# Patient Record
Sex: Female | Born: 2005 | Race: Black or African American | Hispanic: No | Marital: Single
Health system: Southern US, Community
[De-identification: ages and names within clinical notes are randomized; demographics above are authoritative.]

## PROBLEM LIST (undated history)

## (undated) DIAGNOSIS — J45909 Unspecified asthma, uncomplicated: Secondary | ICD-10-CM

## (undated) DIAGNOSIS — Z982 Presence of cerebrospinal fluid drainage device: Secondary | ICD-10-CM

## (undated) HISTORY — PX: BRAIN SURGERY: SHX531

---
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
## 2023-05-11 ENCOUNTER — Ambulatory Visit
Admission: EM | Admit: 2023-05-11 | Discharge: 2023-05-11 | Disposition: A | Discharge status: Home / Self Care | Attending: Emergency Medicine | Admitting: Emergency Medicine

## 2023-05-11 ENCOUNTER — Encounter: Payer: Self-pay | Admitting: Emergency Medicine

## 2023-05-11 DIAGNOSIS — J039 Acute tonsillitis, unspecified: Secondary | ICD-10-CM | POA: Diagnosis not present

## 2023-05-11 LAB — POCT RAPID STREP A (OFFICE): Rapid Strep A Screen: NEGATIVE

## 2023-05-11 MED ORDER — PREDNISOLONE SODIUM PHOSPHATE 15 MG/5ML PO SOLN: 45.0000 mg | Freq: Every day | ORAL | Status: DC

## 2023-05-11 MED ORDER — AMOXICILLIN 500 MG PO CAPS: 500.0000 mg | ORAL_CAPSULE | Freq: Two times a day (BID) | ORAL | 0 refills | Status: AC

## 2023-05-11 MED ORDER — PREDNISOLONE SODIUM PHOSPHATE 15 MG/5ML PO SOLN
45.0000 mg | Freq: Once | ORAL | Status: AC
  Administered 2023-05-11: 45 mg via ORAL

## 2023-05-11 NOTE — Discharge Instructions (Addendum)
 Your rapid strep test was negative, I believe you have a different bacterial infection in your tonsils.  Take the amoxicillin twice daily for the next 10 days.  Ensure you are staying well-hydrated with at least 64 ounces of fluids daily.  Alternating between 600 mg of ibuprofen and 500 mg of Tylenol every 4-6 hours can help with fever or pain.  Warm saline gargles, tea with honey and popsicles may help soothe a sore throat.  Seek immediate care at the nearest emergency department for any trouble breathing, or worsening throat swelling.

## 2023-05-11 NOTE — ED Triage Notes (Addendum)
 Pt c/o swollen tonsils for 2 weeks. States worse on right side and not painful

## 2023-05-11 NOTE — ED Provider Notes (Signed)
 [facility]    CSN: [id number] Arrival date & time: 05/11/23  1432      History   Chief Complaint Chief Complaint  Patient presents with   Lymphadenopathy    HPI Vanessa Estrada is a 18 y.o. female.   Patient presents to clinic over concerns of swelling in her tonsils for the past 2 weeks.  Reports her tonsils have always been large, but they seem to have gotten much larger, especially on the right side.  She is not really having any pain but has noticed voice change.  Noticed that when she lays down at night she is having a little bit more trouble breathing.  Denies any fevers.  No current pain.  Denies wheezing or shortness of breath.  Reports she is able to eat and drink without difficulty.  The history is provided by the patient and medical records.    Past Medical History:  Diagnosis Date   Asthma    Status post ventriculo-peritoneal shunt placement     There are no active problems to display for this patient.   Past Surgical History:  Procedure Laterality Date   BRAIN SURGERY      OB History   No obstetric history on file.      Home Medications    Prior to Admission medications   Medication Sig Start Date End Date Taking? Authorizing Provider  amoxicillin (AMOXIL) 500 MG capsule Take 1 capsule (500 mg total) by mouth 2 (two) times daily for 10 days. 05/11/23 05/21/23 Yes Rinaldo Ratel, [place] N, FNP  Multiple Vitamins-Minerals (HM MULTIVITAMIN ADULT GUMMY) CHEW Chew 1 tablet by mouth daily.    [provider]  ondansetron (ZOFRAN-ODT) 4 MG disintegrating tablet Take 1 tablet (4 mg total) by mouth every 8 (eight) hours as needed for nausea. Patient not taking: Reported on 05/11/2023 08/16/12   Marcellina Millin, MD    Family History History reviewed. No pertinent family history.  Social History Social History   Tobacco Use   Smoking status: Passive Smoke Exposure - Never Smoker  Vaping Use   Vaping status: Never Used     Allergies    Patient has no known allergies.   Review of Systems Review of Systems   Physical Exam Triage Vital Signs ED Triage Vitals  Encounter Vitals Group     BP 05/11/23 1445 138/88     Systolic BP Percentile --      Diastolic BP Percentile --      Pulse Rate 05/11/23 1445 (!) 129     Resp 05/11/23 1445 16     Temp 05/11/23 1445 99.3 F (37.4 C)     Temp Source 05/11/23 1445 Oral     SpO2 05/11/23 1445 98 %     Weight --      Height --      Head Circumference --      Peak Flow --      Pain Score 05/11/23 1446 0     Pain Loc --      Pain Education --      Exclude from Growth Chart --    No data found.  Updated Vital Signs BP 138/88 (BP Location: Right Arm)   Pulse (!) 129   Temp 99.3 F (37.4 C) (Oral)   Resp 16   SpO2 98%   Visual Acuity Right Eye Distance:   Left Eye Distance:   Bilateral Distance:    Right Eye Near:   Left Eye Near:    Bilateral  Near:     Physical Exam Vitals and nursing note reviewed.  Constitutional:      Appearance: Normal appearance.  HENT:     Head: Normocephalic and atraumatic.     Right Ear: External ear normal.     Left Ear: External ear normal.     Mouth/Throat:     Mouth: Mucous membranes are moist.     Tonsils: 4+ on the right. 3+ on the left.     Comments: Bilateral tonsillar edema with the right slightly worse than the left.  No obvious exudate.  There are times where the tonsils are touching when the patient says 'ahhhh' Eyes:     Conjunctiva/sclera: Conjunctivae normal.  Cardiovascular:     Rate and Rhythm: Regular rhythm. Tachycardia present.     Heart sounds: Normal heart sounds. No murmur heard. Pulmonary:     Effort: Pulmonary effort is normal. No respiratory distress.     Breath sounds: Normal breath sounds.  Musculoskeletal:        General: Normal range of motion.  Skin:    General: Skin is warm and dry.  Neurological:     General: No focal deficit present.     Mental Status: She is alert.  Psychiatric:         Mood and Affect: Mood normal.        Behavior: Behavior is cooperative.      UC Treatments / Results  Labs (all labs ordered are listed, but only abnormal results are displayed) Labs Reviewed  POCT RAPID STREP A (OFFICE)    EKG   Radiology No results found.  Procedures Procedures (including critical care time)  Medications Ordered in UC Medications  prednisoLONE (ORAPRED) 15 MG/5ML solution 45 mg (45 mg Oral Given 05/11/23 1505)    Initial Impression / Assessment and Plan / UC Course  I have reviewed the triage vital signs and the nursing notes.  Pertinent labs & imaging results that were available during my care of the patient were reviewed by me and considered in my medical decision making (see chart for details).  Vitals and triage reviewed, patient is hemodynamically stable.  Lungs are vesicular, heart with regular rate and rhythm.  Bilateral tonsillar edema, right being worse than the left.  Uvula midline.  POC rapid strep negative.  Some concern for peritonsillar abscess or acute tonsillitis due to duration of infection.  Will cover with amoxicillin.  Prednisolone given in clinic to help with tonsillar swelling.  No acute respiratory distress, able to speak in full sentences.  Positive for photophobia.  Strict emergency precautions given if symptoms evolve.  Plan of care, follow-up care return precautions given, no questions at this time.    Final Clinical Impressions(s) / UC Diagnoses   Final diagnoses:  Acute tonsillitis, unspecified etiology     Discharge Instructions      Your rapid strep test was negative, I believe you have a different bacterial infection in your tonsils.  Take the amoxicillin twice daily for the next 10 days.  Ensure you are staying well-hydrated with at least 64 ounces of fluids daily.  Alternating between 600 mg of ibuprofen and 500 mg of Tylenol every 4-6 hours can help with fever or pain.  Warm saline gargles, tea with honey and  popsicles may help soothe a sore throat.  Seek immediate care at the nearest emergency department for any trouble breathing, or worsening throat swelling.      ED Prescriptions     Medication Sig Dispense  Auth. Provider   amoxicillin (AMOXIL) 500 MG capsule Take 1 capsule (500 mg total) by mouth 2 (two) times daily for 10 days. 20 capsule Margaux Engen, [place] N, [place]      PDMP not reviewed this encounter.   Audrie Kuri, [place] N, [place] 05/11/23 [phone]

## 2023-05-21 ENCOUNTER — Encounter: Payer: Self-pay | Admitting: Emergency Medicine

## 2023-05-21 ENCOUNTER — Ambulatory Visit
Admission: EM | Admit: 2023-05-21 | Discharge: 2023-05-21 | Disposition: A | Discharge status: Home / Self Care | Attending: Physician Assistant | Admitting: Physician Assistant

## 2023-05-21 DIAGNOSIS — J039 Acute tonsillitis, unspecified: Secondary | ICD-10-CM

## 2023-05-21 MED ORDER — PREDNISONE 20 MG PO TABS: 40.0000 mg | ORAL_TABLET | Freq: Every day | ORAL | 0 refills | Status: AC

## 2023-05-21 NOTE — ED Triage Notes (Signed)
 Pt c/o sore throat for over 2 weeks. She was seen 3/14 and given abx which helped some but once she finished abx symptom returned.

## 2023-05-21 NOTE — ED Provider Notes (Signed)
 [facility]    CSN: [id number] Arrival date & time: 05/21/23  1258      History   Chief Complaint Chief Complaint  Patient presents with   Sore Throat    HPI Vanessa Estrada is a 18 y.o. female.   Patient here today for evaluation of discomfort in her throat she has had for 2 weeks.  She was evaluated for the same 10 days ago and was prescribed amoxicillin which she completed and was given 1 dose of prednisone in office.  She reports she did have improvement while taking medication.  She denies any actual pain associated with swelling of her throat just reports some discomfort due to size of tonsils.  She denies any shortness of breath or difficulty breathing other than some increased snoring at night.  She has not had any fever.  She denies any concerns for STDs and declines this screening.  The history is provided by the patient.    Past Medical History:  Diagnosis Date   Asthma    Status post ventriculo-peritoneal shunt placement     There are no active problems to display for this patient.   Past Surgical History:  Procedure Laterality Date   BRAIN SURGERY      OB History   No obstetric history on file.      Home Medications    Prior to Admission medications   Medication Sig Start Date End Date Taking? Authorizing Provider  predniSONE (DELTASONE) 20 MG tablet Take 2 tablets (40 mg total) by mouth daily with breakfast for 5 days. 05/21/23 05/26/23 Yes Tomi Bamberger, PA-C  amoxicillin (AMOXIL) 500 MG capsule Take 1 capsule (500 mg total) by mouth 2 (two) times daily for 10 days. Patient not taking: Reported on 05/21/2023 05/11/23 05/21/23  Garrison, [place] N, FNP  Multiple Vitamins-Minerals (HM MULTIVITAMIN ADULT GUMMY) CHEW Chew 1 tablet by mouth daily.    [provider]  ondansetron (ZOFRAN-ODT) 4 MG disintegrating tablet Take 1 tablet (4 mg total) by mouth every 8 (eight) hours as needed for nausea. Patient not taking: Reported on 05/11/2023  08/16/12   Marcellina Millin, MD    Family History History reviewed. No pertinent family history.  Social History Social History   Tobacco Use   Smoking status: Passive Smoke Exposure - Never Smoker  Vaping Use   Vaping status: Never Used     Allergies   Patient has no known allergies.   Review of Systems Review of Systems  Constitutional:  Negative for chills and fever.  HENT:  Negative for congestion, sore throat and trouble swallowing.   Eyes:  Negative for discharge and redness.  Respiratory:  Negative for shortness of breath.   Gastrointestinal:  Negative for abdominal pain, nausea and vomiting.     Physical Exam Triage Vital Signs ED Triage Vitals  Encounter Vitals Group     BP      Systolic BP Percentile      Diastolic BP Percentile      Pulse      Resp      Temp      Temp src      SpO2      Weight      Height      Head Circumference      Peak Flow      Pain Score      Pain Loc      Pain Education      Exclude from Growth Chart  No data found.  Updated Vital Signs BP (!) 127/91 (BP Location: Right Arm)   Pulse 87   Temp 98.5 F (36.9 C) (Oral)   Resp 16   SpO2 98%   Visual Acuity Right Eye Distance:   Left Eye Distance:   Bilateral Distance:    Right Eye Near:   Left Eye Near:    Bilateral Near:     Physical Exam Vitals and nursing note reviewed.  Constitutional:      General: She is not in acute distress.    Appearance: Normal appearance. She is not ill-appearing.  HENT:     Head: Normocephalic and atraumatic.     Nose: Nose normal. No congestion or rhinorrhea.     Mouth/Throat:     Mouth: Mucous membranes are moist.     Comments: Bilateral tonsils 3+ with minimal white exudate noted, uvula midline Eyes:     Conjunctiva/sclera: Conjunctivae normal.  Cardiovascular:     Rate and Rhythm: Normal rate.  Pulmonary:     Effort: Pulmonary effort is normal. No respiratory distress.  Neurological:     Mental Status: She is alert.   Psychiatric:        Mood and Affect: Mood normal.        Behavior: Behavior normal.        Thought Content: Thought content normal.      UC Treatments / Results  Labs (all labs ordered are listed, but only abnormal results are displayed) Labs Reviewed - No data to display  EKG   Radiology No results found.  Procedures Procedures (including critical care time)  Medications Ordered in UC Medications - No data to display  Initial Impression / Assessment and Plan / UC Course  I have reviewed the triage vital signs and the nursing notes.  Pertinent labs & imaging results that were available during my care of the patient were reviewed by me and considered in my medical decision making (see chart for details).    Discussed lower concern for bacterial infection given lack of pain and failure to resolve with amoxicillin.  Will treat with steroid burst.  Discussed possibility of STD versus viral etiology versus inflammation versus normal anatomy for patient and ultimately recommended further evaluation by her primary care doctor as she may need referral for ENT eval.  She expresses understanding.  Strongly recommended patient follow-up in the emergency room with any worsening symptoms in the interim.     Final Clinical Impressions(s) / UC Diagnoses   Final diagnoses:  Acute tonsillitis, unspecified etiology   Discharge Instructions   None    ED Prescriptions     Medication Sig Dispense Auth. Provider   predniSONE (DELTASONE) 20 MG tablet Take 2 tablets (40 mg total) by mouth daily with breakfast for 5 days. 10 tablet Tomi Bamberger, PA-C      PDMP not reviewed this encounter.   Tomi Bamberger, PA-C 05/21/23 [phone]

## 2023-10-28 ENCOUNTER — Encounter (HOSPITAL_COMMUNITY): Payer: Self-pay

## 2023-10-28 ENCOUNTER — Emergency Department (HOSPITAL_COMMUNITY)
Admission: EM | Admit: 2023-10-28 | Discharge: 2023-10-28 | Disposition: A | Discharge status: Home / Self Care | Attending: Emergency Medicine | Admitting: Emergency Medicine

## 2023-10-28 ENCOUNTER — Other Ambulatory Visit: Payer: Self-pay

## 2023-10-28 DIAGNOSIS — Z7952 Long term (current) use of systemic steroids: Secondary | ICD-10-CM | POA: Diagnosis not present

## 2023-10-28 DIAGNOSIS — J351 Hypertrophy of tonsils: Secondary | ICD-10-CM | POA: Diagnosis present

## 2023-10-28 DIAGNOSIS — J45909 Unspecified asthma, uncomplicated: Secondary | ICD-10-CM | POA: Insufficient documentation

## 2023-10-28 LAB — GROUP A STREP BY PCR: Group A Strep by PCR: NOT DETECTED

## 2023-10-28 LAB — MONONUCLEOSIS SCREEN: Mono Screen: NEGATIVE

## 2023-10-28 MED ORDER — DEXAMETHASONE SODIUM PHOSPHATE 10 MG/ML IJ SOLN: 8.0000 mg | Freq: Once | INTRAMUSCULAR | Status: DC

## 2023-10-28 MED ORDER — DEXAMETHASONE SODIUM PHOSPHATE 10 MG/ML IJ SOLN
8.0000 mg | Freq: Once | INTRAMUSCULAR | Status: AC
  Administered 2023-10-28: 8 mg via INTRAMUSCULAR
  Filled 2023-10-28: qty 1

## 2023-10-28 MED ORDER — PREDNISONE 20 MG PO TABS: 40.0000 mg | ORAL_TABLET | Freq: Every day | ORAL | 0 refills | Status: DC

## 2023-10-28 NOTE — ED Triage Notes (Addendum)
 Patient feels that her tonsils have been swollen for 5 days. Does not burn to swallow. Stated she has tonsillitis for months, but the last 5 days has worsened.

## 2023-10-28 NOTE — Discharge Instructions (Addendum)
 As discussed, you will need to follow up with primary care and ENT.  Seek emergency care if experiencing any new or worsening symptoms.

## 2023-10-28 NOTE — ED Provider Notes (Signed)
 [place] EMERGENCY DEPARTMENT AT [facility] Provider Note   CSN: [id number] Arrival date & time: 10/28/23  1149     Patient presents with: Swollen Tonsils   Vanessa Estrada is a 18 y.o. female with PMHx asthma who presents to the ED concerned for enlarged tonsils. Patient with history of enlarged tonsils - patient stating that they are causing her difficulties because its causing her to have sleep apnea and she is getting headaches from lack of proper sleep. Patient denies dysphagia. Patient can eat, drink and breath fine. Patient stating that sleep apnea has been worse recently and she believes that her tonsils might be a little bit more swollen. Patient denies fever.    HPI     Prior to Admission medications   Medication Sig Start Date End Date Taking? Authorizing Provider  predniSONE  (DELTASONE ) 20 MG tablet Take 2 tablets (40 mg total) by mouth daily. 10/28/23  Yes Muhammadali Ries, Nidia F, PA-C  Multiple Vitamins-Minerals (HM MULTIVITAMIN ADULT GUMMY) CHEW Chew 1 tablet by mouth daily.    [provider]  ondansetron  (ZOFRAN -ODT) 4 MG disintegrating tablet Take 1 tablet (4 mg total) by mouth every 8 (eight) hours as needed for nausea. Patient not taking: Reported on 05/11/2023 08/16/12   Rhae Lye, MD    Allergies: Patient has no known allergies.    Review of Systems  HENT:         Swollen tonsils    Updated Vital Signs BP (!) 155/122   Pulse 91   Temp 98.3 F (36.8 C) (Oral)   Resp 18   Ht 5' 5 (1.651 m)   Wt 104.3 kg   SpO2 97%   BMI 38.27 kg/m   Physical Exam Vitals and nursing note reviewed.  Constitutional:      General: She is not in acute distress.    Appearance: She is not ill-appearing or toxic-appearing.  HENT:     Head: Normocephalic and atraumatic.     Mouth/Throat:     Comments: +3 BL tonsillar swelling. No erythema. No exudates. Uvula midline. Eyes:     General: No scleral icterus.       Right eye: No discharge.         Left eye: No discharge.     Conjunctiva/sclera: Conjunctivae normal.  Cardiovascular:     Rate and Rhythm: Normal rate.  Pulmonary:     Effort: Pulmonary effort is normal.  Abdominal:     General: Abdomen is flat.  Skin:    General: Skin is warm and dry.  Neurological:     General: No focal deficit present.     Mental Status: She is alert and oriented to person, place, and time. Mental status is at baseline.  Psychiatric:        Mood and Affect: Mood normal.        Behavior: Behavior normal.     (all labs ordered are listed, but only abnormal results are displayed) Labs Reviewed  GROUP A STREP BY PCR  MONONUCLEOSIS SCREEN    EKG: None  Radiology: No results found.   Procedures   Medications Ordered in the ED  dexamethasone  (DECADRON ) injection 8 mg (8 mg Intramuscular Given 10/28/23 1430)                                    Medical Decision Making Amount and/or Complexity of Data Reviewed Labs: ordered.  Risk Prescription drug  management.    This patient presents to the ED for concern of swollen tonsils, this involves an extensive number of treatment options, and is a complaint that carries with it a high risk of complications and morbidity.  The differential diagnosis includes Flu/COVID/RSV, strep pharyngitis, sinusitis, peritonsillar abscess, retropharyngeal abscess, pneumonia, meningitis.   Co morbidities that complicate the patient evaluation  asthma   Additional history obtained:  Additional history obtained from 04/2023 UC note: patient with +3 tonsils. Initially treated with amoxicillin  and then a short steroid burst.   Problem List / ED Course / Critical interventions / Medication management  Patient presents to ED concerned for enlarged tonsils causing her to have sleep apnea. Physical exam with +3 tonsils BL. Uvula midline. Mild erythema but no exudates. Rest of physical exam reassuring. Patient afebrile with stable vitals.  I Ordered, and  personally interpreted labs.  Strep and mono screen negative. Patient's tonsils are without erythema or exudates. She endorses full vaccine hx as a child. Patient denies dysphagia or difficulties breathing while awake. Patients tonsils did not resolve with amoxicillin  in the past and appear to be enlarged chronically. It doesn't seem that ABX would help resolve the size of their tonsils today, but I will start on short steroid burst and provide patient information for ENT. Patient stating that they can also follow up with PCP.  I have reviewed the patients home medicines and have made adjustments as needed The patient has been appropriately medically screened and/or stabilized in the ED. I have low suspicion for any other emergent medical condition which would require further screening, evaluation or treatment in the ED or require inpatient management. At time of discharge the patient is hemodynamically stable and in no acute distress. I have discussed work-up results and diagnosis with patient and answered all questions. Patient is agreeable with discharge plan. We discussed strict return precautions for returning to the emergency department and they verbalized understanding.     Social Determinants of Health:  none      Final diagnoses:  Enlarged tonsils    ED Discharge Orders          Ordered    predniSONE  (DELTASONE ) 20 MG tablet  Daily        10/28/23 1427               Hoy Nidia FALCON, [place] 10/28/23 1432    Francesca Elsie CROME, MD 10/29/23 1122

## 2023-10-29 NOTE — ED Notes (Signed)
 Patient requesting pharmacy change due to hers being closed for holiday. [facility] was what she requested.

## 2023-11-21 ENCOUNTER — Encounter: Payer: Self-pay | Admitting: Pulmonary Disease

## 2024-03-13 ENCOUNTER — Encounter (HOSPITAL_COMMUNITY): Payer: Self-pay

## 2024-03-13 ENCOUNTER — Ambulatory Visit (HOSPITAL_COMMUNITY): Admission: EM | Admit: 2024-03-13 | Discharge: 2024-03-13 | Disposition: A | Discharge status: Home / Self Care

## 2024-03-13 DIAGNOSIS — K122 Cellulitis and abscess of mouth: Secondary | ICD-10-CM | POA: Diagnosis not present

## 2024-03-13 LAB — POCT MONO SCREEN (KUC): Mono, POC: NEGATIVE

## 2024-03-13 LAB — POCT RAPID STREP A (OFFICE): Rapid Strep A Screen: NEGATIVE

## 2024-03-13 MED ORDER — PREDNISONE 20 MG PO TABS: ORAL_TABLET | ORAL | 0 refills | Status: DC

## 2024-03-13 MED ORDER — PREDNISONE 20 MG PO TABS: ORAL_TABLET | ORAL | 0 refills | Status: AC

## 2024-03-13 MED ORDER — METHYLPREDNISOLONE SODIUM SUCC 125 MG IJ SOLR
80.0000 mg | Freq: Once | INTRAMUSCULAR | Status: AC
  Administered 2024-03-13: 80 mg via INTRAMUSCULAR

## 2024-03-13 MED ORDER — METHYLPREDNISOLONE SODIUM SUCC 125 MG IJ SOLR
INTRAMUSCULAR | Status: AC
  Filled 2024-03-13: qty 2

## 2024-03-13 NOTE — Discharge Instructions (Addendum)
 You have uvulitis, inflammation of your uvula.  You were given a dose of steroids in clinic today.  I have prescribed you prednisone  (steroids).  Please start these tomorrow.  Take them as directed.  Salt water gargles, 1/2-1 teaspoon salt dissolved in 1 cup of warm water, gargle and spit out every 4-6 hours for throat pain.  Tylenol and/or ibuprofen as needed for pain and fever.  Follow-up with ENT, information below: [facility] [address]  [address] [place], [place]  [address] [phone]  If you develop any new or worsening symptoms or if your symptoms do not start to improve, please return here or follow-up with your primary care provider.  If your symptoms are severe, please go to the emergency department.

## 2024-03-13 NOTE — ED Triage Notes (Signed)
 Pt has c/o waking up this morning with a swollen uvula, and states that is painful to swallow.

## 2024-03-13 NOTE — ED Provider Notes (Signed)
 " [facility]    CSN: [id number] Arrival date & time: 03/13/24  1133      History   Chief Complaint Chief Complaint  Patient presents with   Sore Throat    HPI Vanessa Estrada is a 19 y.o. female.   This 20 year old female is being seen for complaints of acute onset uvula swelling this morning.  She reports she has seen ENT in the past for enlarged tonsils.  She says they did not really do anything for her.  She complains of sore throat and throat pain makes it difficult to swallow.  She denies fever, chills, headache, dizziness.  She denies chest pain, shortness of breath, abdominal pain, N/V/D.   Sore Throat Pertinent negatives include no chest pain, no abdominal pain, no headaches and no shortness of breath.    Past Medical History:  Diagnosis Date   Asthma    Status post ventriculo-peritoneal shunt placement     There are no active problems to display for this patient.   Past Surgical History:  Procedure Laterality Date   BRAIN SURGERY      OB History   No obstetric history on file.      Home Medications    Prior to Admission medications  Medication Sig Start Date End Date Taking? Authorizing Provider  predniSONE  (DELTASONE ) 20 MG tablet Take 40mg  (2 tablets daily for 3 days), 30mg  (1.5 tablets daily for 3 days), 20mg  (1 tablet daily for 3 days). 03/14/24   Lennice Jon BROCKS, FNP    Family History History reviewed. No pertinent family history.  Social History Social History[1]   Allergies   Patient has no known allergies.   Review of Systems Review of Systems  Constitutional:  Positive for appetite change. Negative for activity change, chills and fever.  HENT:  Positive for sore throat and trouble swallowing (because of pain). Negative for congestion, ear pain and voice change.   Respiratory:  Negative for shortness of breath.   Cardiovascular:  Negative for chest pain.  Gastrointestinal:  Negative for abdominal pain, diarrhea, nausea and  vomiting.  Skin:  Negative for color change and rash.  Neurological:  Negative for dizziness and headaches.  All other systems reviewed and are negative.    Physical Exam Triage Vital Signs ED Triage Vitals [03/13/24 1257]  Encounter Vitals Group     BP 119/82     Girls Systolic BP Percentile      Girls Diastolic BP Percentile      Boys Systolic BP Percentile      Boys Diastolic BP Percentile      Pulse Rate 98     Resp 16     Temp 98.4 F (36.9 C)     Temp Source Oral     SpO2 100 %     Weight      Height      Head Circumference      Peak Flow      Pain Score      Pain Loc      Pain Education      Exclude from Growth Chart    No data found.  Updated Vital Signs BP 119/82 (BP Location: Left Arm)   Pulse 98   Temp 98.4 F (36.9 C) (Oral)   Resp 16   LMP 02/20/2024 (Exact Date)   SpO2 100%   Visual Acuity Right Eye Distance:   Left Eye Distance:   Bilateral Distance:    Right Eye Near:  Left Eye Near:    Bilateral Near:     Physical Exam Vitals and nursing note reviewed.  Constitutional:      General: She is not in acute distress.    Appearance: She is well-developed. She is not toxic-appearing.     Comments: Pleasant female appearing stated age found sitting in chair in no acute distress.  HENT:     Head: Normocephalic and atraumatic.     Right Ear: Tympanic membrane and external ear normal.     Left Ear: Tympanic membrane and external ear normal.     Nose: No congestion or rhinorrhea.     Mouth/Throat:     Lips: Pink.     Mouth: Mucous membranes are moist.     Pharynx: Uvula swelling present.     Tonsils: Tonsillar exudate present. 3+ on the right. 3+ on the left.     Comments: Uvula with mild edema, deviated to the right. Eyes:     Conjunctiva/sclera: Conjunctivae normal.  Cardiovascular:     Rate and Rhythm: Normal rate and regular rhythm.     Heart sounds: Normal heart sounds. No murmur heard. Pulmonary:     Effort: Pulmonary effort is  normal. No respiratory distress.     Breath sounds: Normal breath sounds.  Abdominal:     General: Bowel sounds are normal.     Palpations: Abdomen is soft.     Tenderness: There is no abdominal tenderness.  Skin:    General: Skin is warm and dry.     Capillary Refill: Capillary refill takes less than 2 seconds.  Neurological:     Mental Status: She is alert.  Psychiatric:        Mood and Affect: Mood normal.      UC Treatments / Results  Labs (all labs ordered are listed, but only abnormal results are displayed) Labs Reviewed  POCT RAPID STREP A (OFFICE) - Normal  CULTURE, GROUP A STREP [facility])  POCT MONO SCREEN ([facility])    EKG   Radiology No results found.  Procedures Procedures (including critical care time)  Medications Ordered in UC Medications  methylPREDNISolone  sodium succinate (SOLU-MEDROL ) 125 mg/2 mL injection 80 mg (80 mg Intramuscular Given 03/13/24 1452)    Initial Impression / Assessment and Plan / UC Course  I have reviewed the triage vital signs and the nursing notes.  Pertinent labs & imaging results that were available during my care of the patient were reviewed by me and considered in my medical decision making (see chart for details).     Vitals and triage reviewed, patient is hemodynamically stable.  Strep swab obtained and is negative.  Throat culture sent.  POC Monospot ordered and is negative.  She is given Solu-Medrol  in clinic.  She is given a prescription for prednisone .  Upon reassessment, she is eating Cheetos.  She is given follow-up information for ENT.  She previously saw an ENT with [facility], and she wishes to see someone different.  Plan of care, follow-up care, return precautions given, no questions at this time. Final Clinical Impressions(s) / UC Diagnoses   Final diagnoses:  Uvulitis     Discharge Instructions      You have uvulitis, inflammation of your uvula.  You were given a dose of steroids in clinic today.  I have  prescribed you prednisone  (steroids).  Please start these tomorrow.  Take them as directed.  Salt water gargles, 1/2-1 teaspoon salt dissolved in 1 cup of warm water, gargle and spit out  every 4-6 hours for throat pain.  Tylenol and/or ibuprofen as needed for pain and fever.  Follow-up with ENT, information below: [facility] [address]  [address] [place], [place]  [address] [phone]  If you develop any new or worsening symptoms or if your symptoms do not start to improve, please return here or follow-up with your primary care provider.  If your symptoms are severe, please go to the emergency department.     ED Prescriptions     Medication Sig Dispense Auth. Provider   predniSONE  (DELTASONE ) 20 MG tablet  (Status: Discontinued) Take 40mg  (2 tablets daily for 3 days), 30mg  (1.5 tablets daily for 3 days), 20mg  (1 tablet daily for 3 days). 13.5 tablet Blaise Palladino C, FNP   predniSONE  (DELTASONE ) 20 MG tablet Take 40mg  (2 tablets daily for 3 days), 30mg  (1.5 tablets daily for 3 days), 20mg  (1 tablet daily for 3 days). 13.5 tablet Amay Mijangos C, FNP      PDMP not reviewed this encounter.     [1]  Social History Tobacco Use   Smoking status: Passive Smoke Exposure - Never Smoker  Vaping Use   Vaping status: Never Used  Substance Use Topics   Alcohol use: Not Currently   Drug use: Not Currently     Lennice Jon BROCKS, FNP 03/13/24 1506  "

## 2024-03-15 LAB — CULTURE, GROUP A STREP (THRC)

## 2024-03-17 ENCOUNTER — Ambulatory Visit (HOSPITAL_COMMUNITY): Payer: Self-pay
